# Patient Record
Sex: Male | Born: 1960 | Race: Black or African American | Hispanic: No | Marital: Single
Health system: Southern US, Community
[De-identification: ages and names within clinical notes are randomized; demographics above are authoritative.]

---

## 2019-10-31 ENCOUNTER — Emergency Department (HOSPITAL_COMMUNITY): Payer: Self-pay

## 2019-10-31 ENCOUNTER — Emergency Department (HOSPITAL_COMMUNITY)
Admission: EM | Admit: 2019-10-31 | Discharge: 2019-10-31 | Payer: Self-pay | Attending: Emergency Medicine | Admitting: Emergency Medicine

## 2019-10-31 ENCOUNTER — Other Ambulatory Visit: Payer: Self-pay

## 2019-10-31 ENCOUNTER — Encounter (HOSPITAL_COMMUNITY): Payer: Self-pay

## 2019-10-31 DIAGNOSIS — Z590 Homelessness: Secondary | ICD-10-CM | POA: Insufficient documentation

## 2019-10-31 DIAGNOSIS — R0602 Shortness of breath: Secondary | ICD-10-CM | POA: Insufficient documentation

## 2019-10-31 DIAGNOSIS — R0789 Other chest pain: Secondary | ICD-10-CM | POA: Insufficient documentation

## 2019-10-31 DIAGNOSIS — Z20828 Contact with and (suspected) exposure to other viral communicable diseases: Secondary | ICD-10-CM | POA: Insufficient documentation

## 2019-10-31 DIAGNOSIS — I10 Essential (primary) hypertension: Secondary | ICD-10-CM | POA: Insufficient documentation

## 2019-10-31 LAB — CBC WITH DIFFERENTIAL/PLATELET
Abs Immature Granulocytes: 0.02 10*3/uL (ref 0.00–0.07)
Basophils Absolute: 0 10*3/uL (ref 0.0–0.1)
Basophils Relative: 1 %
Eosinophils Absolute: 0.1 10*3/uL (ref 0.0–0.5)
Eosinophils Relative: 1 %
HCT: 47.6 % (ref 39.0–52.0)
Hemoglobin: 16 g/dL (ref 13.0–17.0)
Immature Granulocytes: 1 %
Lymphocytes Relative: 25 %
Lymphs Abs: 1.1 10*3/uL (ref 0.7–4.0)
MCH: 31.5 pg (ref 26.0–34.0)
MCHC: 33.6 g/dL (ref 30.0–36.0)
MCV: 93.7 fL (ref 80.0–100.0)
Monocytes Absolute: 0.3 10*3/uL (ref 0.1–1.0)
Monocytes Relative: 7 %
Neutro Abs: 2.9 10*3/uL (ref 1.7–7.7)
Neutrophils Relative %: 65 %
Platelets: 203 10*3/uL (ref 150–400)
RBC: 5.08 MIL/uL (ref 4.22–5.81)
RDW: 12.5 % (ref 11.5–15.5)
WBC: 4.3 10*3/uL (ref 4.0–10.5)
nRBC: 0 % (ref 0.0–0.2)

## 2019-10-31 LAB — COMPREHENSIVE METABOLIC PANEL
ALT: 28 U/L (ref 0–44)
AST: 26 U/L (ref 15–41)
Albumin: 4.3 g/dL (ref 3.5–5.0)
Alkaline Phosphatase: 52 U/L (ref 38–126)
Anion gap: 10 (ref 5–15)
BUN: 20 mg/dL (ref 6–20)
CO2: 21 mmol/L — ABNORMAL LOW (ref 22–32)
Calcium: 9.4 mg/dL (ref 8.9–10.3)
Chloride: 107 mmol/L (ref 98–111)
Creatinine, Ser: 1.37 mg/dL — ABNORMAL HIGH (ref 0.61–1.24)
GFR calc Af Amer: >60 mL/min (ref >60)
GFR calc non Af Amer: 56 mL/min — ABNORMAL LOW (ref >60)
Glucose, Bld: 96 mg/dL (ref 70–99)
Potassium: 4.9 mmol/L (ref 3.5–5.1)
Sodium: 138 mmol/L (ref 135–145)
Total Bilirubin: 0.6 mg/dL (ref 0.3–1.2)
Total Protein: 7.5 g/dL (ref 6.5–8.1)

## 2019-10-31 LAB — BRAIN NATRIURETIC PEPTIDE: B Natriuretic Peptide: 24.4 pg/mL (ref 0.0–100.0)

## 2019-10-31 LAB — TROPONIN I (HIGH SENSITIVITY)
Troponin I (High Sensitivity): 7 ng/L (ref <18)
Troponin I (High Sensitivity): 8 ng/L (ref <18)

## 2019-10-31 LAB — SARS CORONAVIRUS 2 (TAT 6-24 HRS): SARS Coronavirus 2: NEGATIVE

## 2019-10-31 MED ORDER — HYDROCHLOROTHIAZIDE 25 MG PO TABS
25.0000 mg | ORAL_TABLET | Freq: Once | ORAL | Status: AC
  Administered 2019-10-31: 25 mg via ORAL
  Filled 2019-10-31: qty 1

## 2019-10-31 MED ORDER — HYDROCODONE-ACETAMINOPHEN 5-325 MG PO TABS
1.0000 | ORAL_TABLET | Freq: Once | ORAL | Status: AC
  Administered 2019-10-31: 1 via ORAL
  Filled 2019-10-31: qty 1

## 2019-10-31 MED ORDER — NITROGLYCERIN 0.4 MG SL SUBL
0.4000 mg | SUBLINGUAL_TABLET | SUBLINGUAL | Status: DC | PRN
  Administered 2019-10-31 (×2): 0.4 mg via SUBLINGUAL
  Filled 2019-10-31 (×2): qty 1

## 2019-10-31 MED ORDER — DOXYCYCLINE HYCLATE 100 MG PO TABS
100.0000 mg | ORAL_TABLET | Freq: Once | ORAL | Status: AC
  Administered 2019-10-31: 100 mg via ORAL
  Filled 2019-10-31: qty 1

## 2019-10-31 MED ORDER — ASPIRIN 81 MG PO CHEW
324.0000 mg | CHEWABLE_TABLET | Freq: Once | ORAL | Status: DC
  Filled 2019-10-31: qty 4

## 2019-10-31 NOTE — ED Notes (Signed)
Patient Alert and oriented to baseline. Stable and ambulatory to baseline. Patient verbalized understanding of the discharge instructions. AVS given to [facility] upon discharge. Pt placed in wheelchair with PD escort upon discharge.

## 2019-10-31 NOTE — Discharge Instructions (Addendum)
As discussed, your evaluation today has been largely reassuring.  But, it is important that you monitor your condition carefully, and do not hesitate to return to the ED if you develop new, or concerning changes in your condition. ? ?Otherwise, please follow-up with your physician for appropriate ongoing care. ? ?

## 2019-10-31 NOTE — ED Triage Notes (Signed)
Pt arrived via [facility] from [place] c/o new onset cp that started last night. Pt also c/o left hand and right foot tingling as well as bilateral ankle swelling. Pt is anxious at time of triage. PD at nurses station in direct sight of pt. No hostility noted at this time. Pt is alert and oriented x4. Current cp rated as 10/10. 324 ASA given PTA w/ EMS. Pt has hx of illicit drug use. PT states he tested positive for COVID-19 2 weeks ago.

## 2019-10-31 NOTE — ED Provider Notes (Signed)
[facility] Provider Note   CSN: [id number] Arrival date & time: 10/31/19  1254     History No chief complaint on file.   Lawrence Carter is a 58 y.o. male.  Patient is a 58 year old male with a history of enlarged heart, hypertension, cocaine abuse and homelessness who is presenting today in the custody of police with chest pain.  Patient states for the last week and a half he has had cough, fever, congestion, generalized aches and pains, intermittent chest pain and shortness of breath.  It has gradually been getting better but in the last few days he is noticed some mild swelling in his lower extremities and still generalized body aches.  Today he was found to be shoplifting and he attempted to flee the police and had to be restrained.  He states he has had some chest pressure in the last few weeks but it became much worse after the struggle.  He denies any injury.  Currently the pain is an 8 out of 10 and it is a pressure sensation in his sternum.  Patient denies significant productive cough and does not think he has had fever in the last few days.  He did go to the hospital last week but did not stay for results.  He is not currently taking any medications.  He does smoke cigarettes and last smoked cocaine approximately 1 week ago.  The history is provided by the patient.       History reviewed. No pertinent past medical history.  There are no problems to display for this patient.        History reviewed. No pertinent family history.  Social History   Tobacco Use  . Smoking status: Not on file  Substance Use Topics  . Alcohol use: Not on file  . Drug use: Not on file    Home Medications Prior to Admission medications   Not on File    Allergies    Patient has no allergy information on record.  Review of Systems   Review of Systems  All other systems reviewed and are negative.   Physical Exam Updated Vital Signs BP (!)  175/116   Pulse 63   Temp 98.3 F (36.8 C) (Oral)   Resp (!) 21   SpO2 99%   Physical Exam Vitals and nursing note reviewed.  Constitutional:      General: He is not in acute distress.    Appearance: Normal appearance. He is well-developed and normal weight.  HENT:     Head: Normocephalic and atraumatic.     Nose: Nose normal.  Eyes:     Conjunctiva/sclera: Conjunctivae normal.     Pupils: Pupils are equal, round, and reactive to light.  Cardiovascular:     Rate and Rhythm: Normal rate and regular rhythm.     Pulses: Normal pulses.     Heart sounds: No murmur.  Pulmonary:     Effort: Pulmonary effort is normal. No respiratory distress.     Breath sounds: Normal breath sounds. No wheezing or rales.  Chest:     Chest wall: Tenderness present.  Abdominal:     General: There is no distension.     Palpations: Abdomen is soft.     Tenderness: There is no abdominal tenderness. There is no guarding or rebound.  Musculoskeletal:        General: No tenderness. Normal range of motion.     Cervical back: Normal range of motion and neck supple.  Right lower leg: Edema present.     Left lower leg: Edema present.     Comments: 1+ pitting edema bilateral ankles  Skin:    General: Skin is warm and dry.     Capillary Refill: Capillary refill takes 2 to 3 seconds.     Findings: No erythema or rash.  Neurological:     General: No focal deficit present.     Mental Status: He is alert and oriented to person, place, and time. Mental status is at baseline.  Psychiatric:        Mood and Affect: Mood normal.        Behavior: Behavior normal.        Thought Content: Thought content normal.     ED Results / Procedures / Treatments   Labs (all labs ordered are listed, but only abnormal results are displayed) Labs Reviewed  COMPREHENSIVE METABOLIC PANEL - Abnormal; Notable for the following components:      Result Value   CO2 21 (*)    Creatinine, Ser 1.37 (*)    GFR calc non Af Amer  56 (*)    All other components within normal limits  SARS CORONAVIRUS 2 (TAT 6-24 HRS)  CBC WITH DIFFERENTIAL/PLATELET  BRAIN NATRIURETIC PEPTIDE  TROPONIN I (HIGH SENSITIVITY)  TROPONIN I (HIGH SENSITIVITY)    EKG EKG Interpretation  Date/Time:  Wednesday October 31 2019 13:44:40 EST Ventricular Rate:  61 PR Interval:    QRS Duration: 128 QT Interval:  382 QTC Calculation: 385 R Axis:   -27 Text Interpretation: Sinus rhythm Left ventricular hypertrophy Nonspecific T abnormalities, inferior leads ST elevation, consider anterior injury No previous tracing Confirmed by Gwyneth Sprout ([id number]) on 10/31/2019 1:49:40 PM   Radiology DG Chest Port 1 View  Result Date: 10/31/2019 CLINICAL DATA:  Incarcerated, new onset chest pain, COVID-19 positive, bilateral ankle swelling EXAM: PORTABLE CHEST 1 VIEW COMPARISON:  CTA chest and radiograph 05/11/2018 FINDINGS: Lung volumes are diminished. Patchy and streaky opacities in the infrahilar lungs. No pneumothorax or effusion. The cardiomediastinal contours are unremarkable. No acute osseous or soft tissue abnormality. Degenerative changes are present in the imaged spine and shoulders. IMPRESSION: Some streaky and patchy opacities could reflect atelectatic change in the setting of low lung volumes although early infection could have a similar appearance. Electronically Signed   By: Kreg Shropshire M.D.   On: 10/31/2019 14:40    Procedures Procedures (including critical care time)  Medications Ordered in ED Medications  nitroGLYCERIN (NITROSTAT) SL tablet 0.4 mg (has no administration in time range)  aspirin chewable tablet 324 mg (has no administration in time range)    ED Course  I have reviewed the triage vital signs and the nursing notes.  Pertinent labs & imaging results that were available during my care of the patient were reviewed by me and considered in my medical decision making (see chart for details).    MDM  Rules/Calculators/A&P                      58 year old male presenting with atypical chest pain.  This has been in the setting of some URI symptoms in the last week but was worse with exertion today when he attempted to flee the police.  Pain is reproducible with palpation and deep breathing.  He does have some mild edema in bilateral lower extremities and has been sleeping in a car the last few days.  Patient was seen at [facility] in [facility] for atypical chest  pain and at that time had an echo and EKG but was not deemed to be a STEMI.  Patient did have LVH at the time.  Since this time patient has continued to use cocaine and tobacco and takes no medications for his underlying medical conditions.  Concern for possible recurrent ACS versus infectious etiology versus musculoskeletal versus CHF.  Low suspicion today for dissection, PE.  EKG today with nonspecific ST abnormalities and LVH.  Will cycle troponins and get a chest x-ray.  Patient was given aspirin and nitroglycerin to see if that helped with his pain.  Also will Covid swab with infectious symptoms last week.  4:09 PM Patient's labs are reassuring.  BMP within normal limits.  Chest x-ray with possible early infiltrate in with 1 week of infectious-like symptoms possible early pneumonia or Covid.  Repeat troponin is pending. If delta trop is neg pt should be safe for d/c with abx.  Still having atypical sx.  Final Clinical Impression(s) / ED Diagnoses Final diagnoses:  None    Rx / DC Orders ED Discharge Orders    None       Blanchie Dessert, MD 10/31/19 1616

## 2019-11-03 ENCOUNTER — Other Ambulatory Visit: Payer: Self-pay

## 2019-11-03 ENCOUNTER — Emergency Department (HOSPITAL_COMMUNITY): Payer: Self-pay

## 2019-11-03 ENCOUNTER — Emergency Department (HOSPITAL_COMMUNITY)
Admission: EM | Admit: 2019-11-03 | Discharge: 2019-11-03 | Payer: Self-pay | Attending: Emergency Medicine | Admitting: Emergency Medicine

## 2019-11-03 DIAGNOSIS — M7061 Trochanteric bursitis, right hip: Secondary | ICD-10-CM

## 2019-11-03 DIAGNOSIS — Y939 Activity, unspecified: Secondary | ICD-10-CM | POA: Insufficient documentation

## 2019-11-03 MED ORDER — IBUPROFEN 400 MG PO TABS
600.0000 mg | ORAL_TABLET | Freq: Once | ORAL | Status: AC
  Administered 2019-11-03: 600 mg via ORAL
  Filled 2019-11-03: qty 1

## 2019-11-03 MED ORDER — OXYCODONE-ACETAMINOPHEN 5-325 MG PO TABS
1.0000 | ORAL_TABLET | Freq: Once | ORAL | Status: AC
  Administered 2019-11-03: 1 via ORAL
  Filled 2019-11-03: qty 1

## 2019-11-03 MED ORDER — DEXAMETHASONE 4 MG PO TABS
8.0000 mg | ORAL_TABLET | Freq: Once | ORAL | Status: AC
  Administered 2019-11-03: 8 mg via ORAL
  Filled 2019-11-03: qty 2

## 2019-11-03 NOTE — ED Triage Notes (Addendum)
Came in thru [facility], reportedly have been having persistent cough, tested for on Tuesday and resulted positive. Main complaint at this time is pain in right arm and right leg. VSS, and was 98% O2 saturation on RA.  - per hospital records, patient tested negative for COVID on the 16th of this month.

## 2019-11-11 NOTE — ED Provider Notes (Signed)
[facility] EMERGENCY DEPARTMENT Provider Note   CSN: [id number] Arrival date & time: 11/03/19  2003     History Chief Complaint  Patient presents with  . Leg Pain    on the right with right arm pain  . Cough    Lawrence Carter is a 58 y.o. male.  HPI   58 year old male with multiple complaints. Is primarily concerned about pain in his right hip. Lateral hip. Denies any acute trauma or strain. Ache at rest. Worse with movement and touch over the area. Has not tried taking anything for it.  No past medical history on file.  There are no problems to display for this patient.      No family history on file.  Social History   Tobacco Use  . Smoking status: Not on file  Substance Use Topics  . Alcohol use: Not on file  . Drug use: Not on file    Home Medications Prior to Admission medications   Not on File    Allergies    Patient has no allergy information on record.  Review of Systems   Review of Systems All systems reviewed and negative, other than as noted in HPI.  Physical Exam Updated Vital Signs BP (!) 145/100   Pulse (!) 58   Temp 97.9 F (36.6 C) (Oral)   Resp 20   Ht 5\' 9"  (1.753 m)   Wt 95.3 kg   SpO2 97%   BMI 31.01 kg/m   Physical Exam Vitals and nursing note reviewed.  Constitutional:      General: He is not in acute distress.    Appearance: He is well-developed.  HENT:     Head: Normocephalic and atraumatic.  Eyes:     General:        Right eye: No discharge.        Left eye: No discharge.     Conjunctiva/sclera: Conjunctivae normal.  Cardiovascular:     Rate and Rhythm: Normal rate and regular rhythm.     Heart sounds: Normal heart sounds. No murmur. No friction rub. No gallop.   Pulmonary:     Effort: Pulmonary effort is normal. No respiratory distress.     Breath sounds: Normal breath sounds.  Abdominal:     General: There is no distension.     Palpations: Abdomen is soft.     Tenderness: There is no  abdominal tenderness.  Musculoskeletal:        General: No tenderness.     Cervical back: Neck supple.     Comments: Tenderness to palpation over the greater trochanter of the right femur. No overlying skin changes. Is able to actively range at the hip without apparent discomfort. Neurovascular intact. No midline spinal tenderness.  Skin:    General: Skin is warm and dry.  Neurological:     Mental Status: He is alert.  Psychiatric:        Behavior: Behavior normal.        Thought Content: Thought content normal.     ED Results / Procedures / Treatments   Labs (all labs ordered are listed, but only abnormal results are displayed) Labs Reviewed - No data to display  EKG None  Radiology No results found.  Procedures Procedures (including critical care time)  Medications Ordered in ED Medications  dexamethasone (DECADRON) tablet 8 mg (8 mg Oral Given 11/03/19 2049)  oxyCODONE-acetaminophen (PERCOCET/ROXICET) 5-325 MG per tablet 1 tablet (1 tablet Oral Given 11/03/19 2050)  ibuprofen (ADVIL)  tablet 600 mg (600 mg Oral Given 11/03/19 2050)    ED Course  I have reviewed the triage vital signs and the nursing notes.  Pertinent labs & imaging results that were available during my care of the patient were reviewed by me and considered in my medical decision making (see chart for details).    MDM Rules/Calculators/A&P                      58 year old male with likely trochanteric bursitis of his right femur. Plan symptomatic treatment. Doubt DVT, serious infection or other emergent process. Final Clinical Impression(s) / ED Diagnoses Final diagnoses:  Trochanteric bursitis of right hip    Rx / DC Orders ED Discharge Orders    None       Raeford Razor, MD 11/11/19 1003

## 2021-07-09 IMAGING — DX DG HIP (WITH OR WITHOUT PELVIS) 2-3V*R*
3 series · 3 of 3 positions shown · non-contrast
Comparison: None.

CLINICAL DATA: Right hip pain

EXAM:
DG HIP (WITH OR WITHOUT PELVIS) 2-3V RIGHT

[pelvis ap]
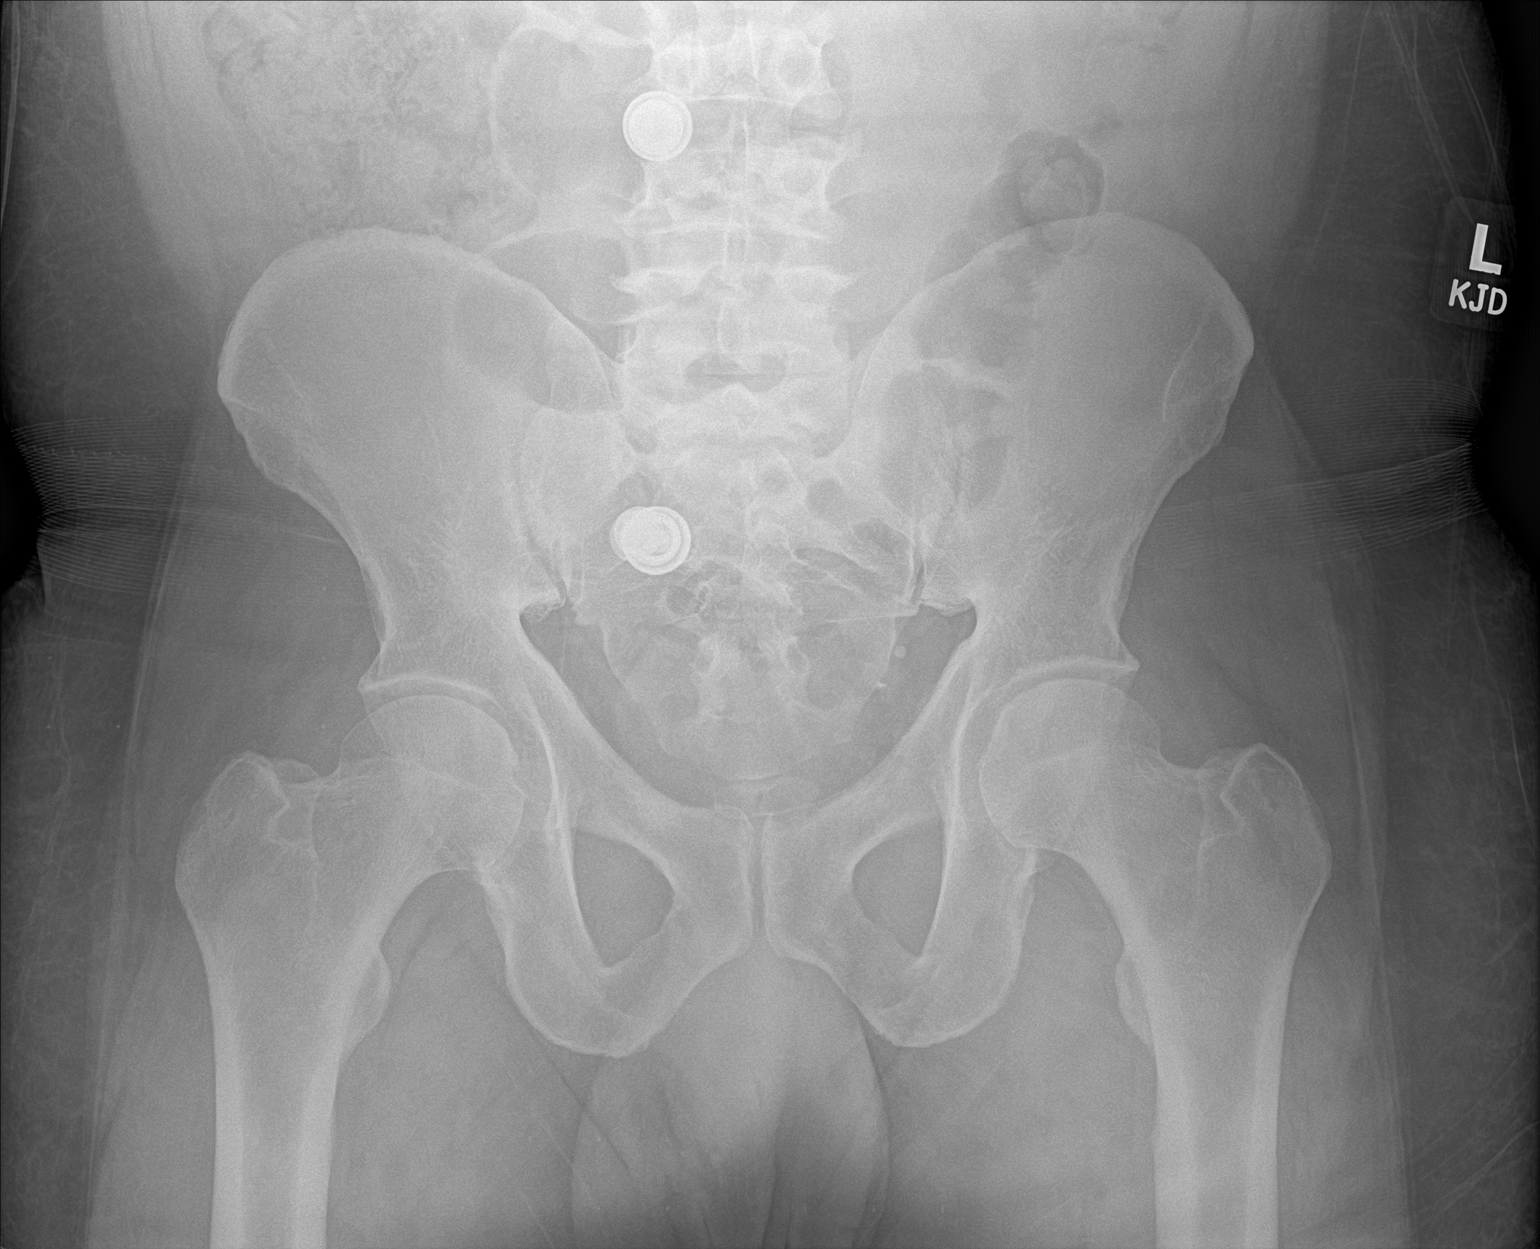

[hip ap]
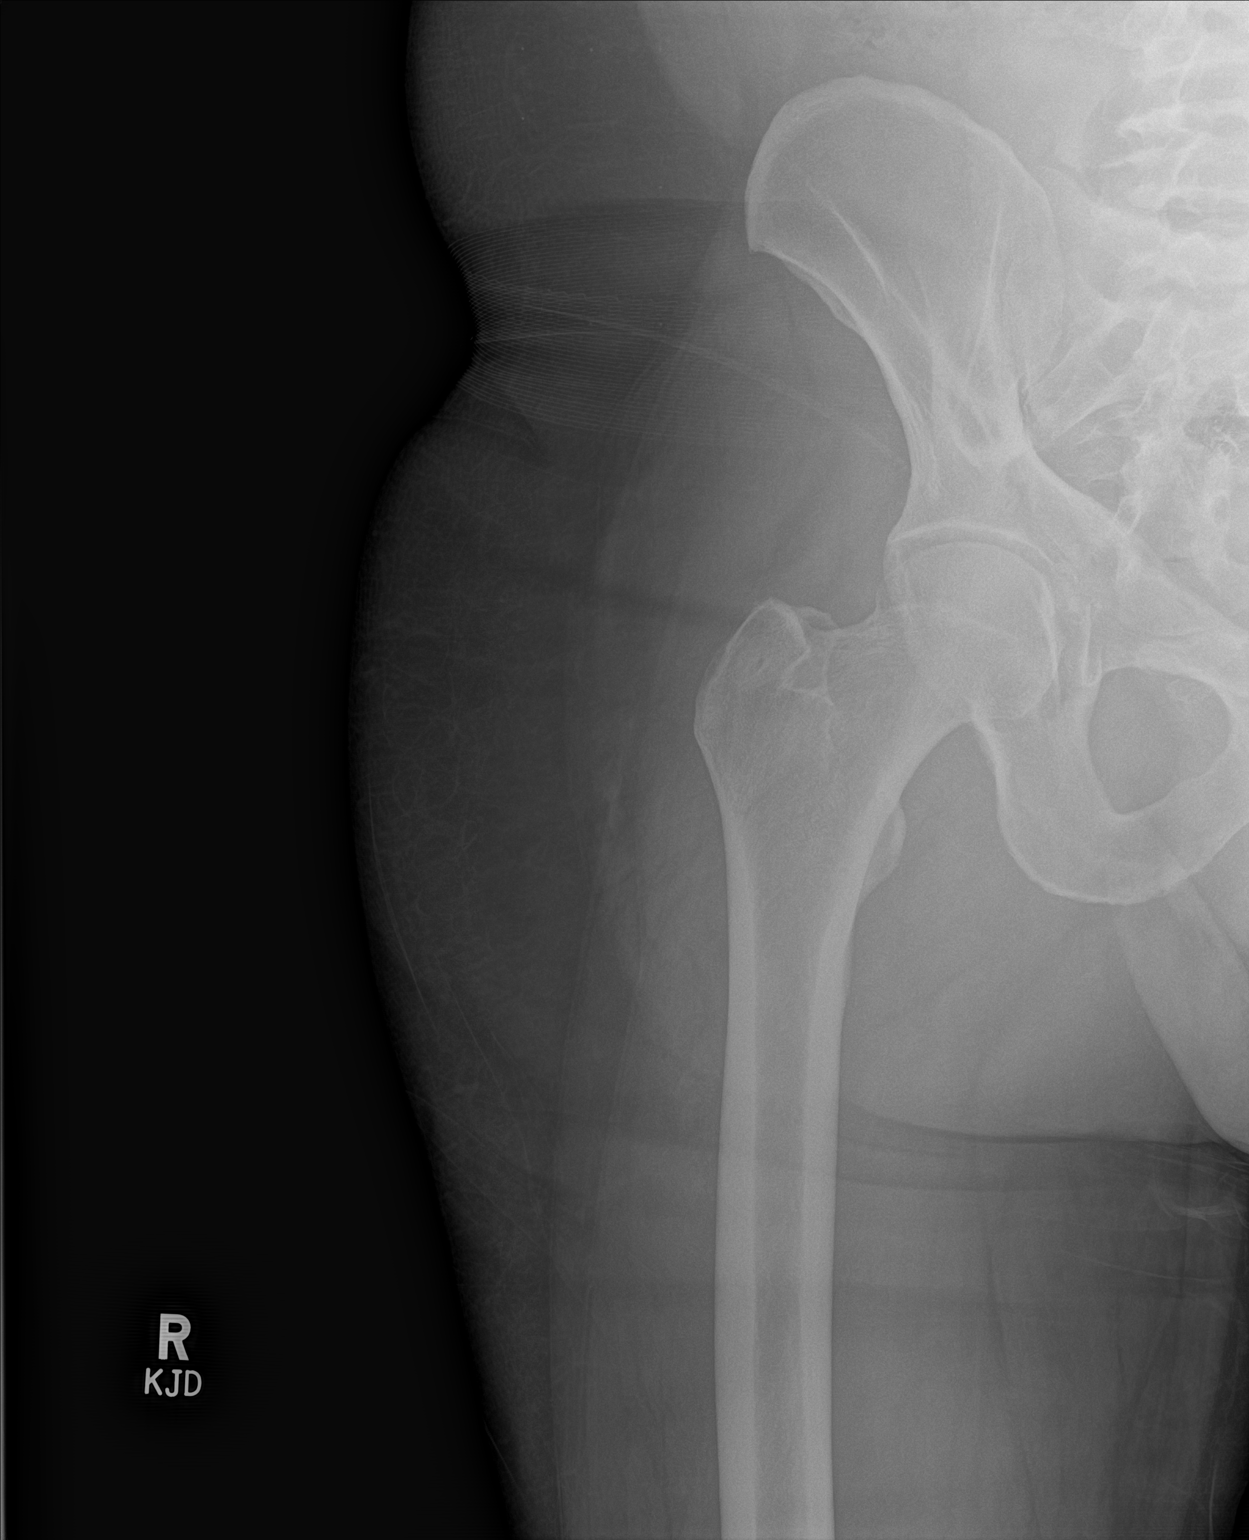

[hip lat]
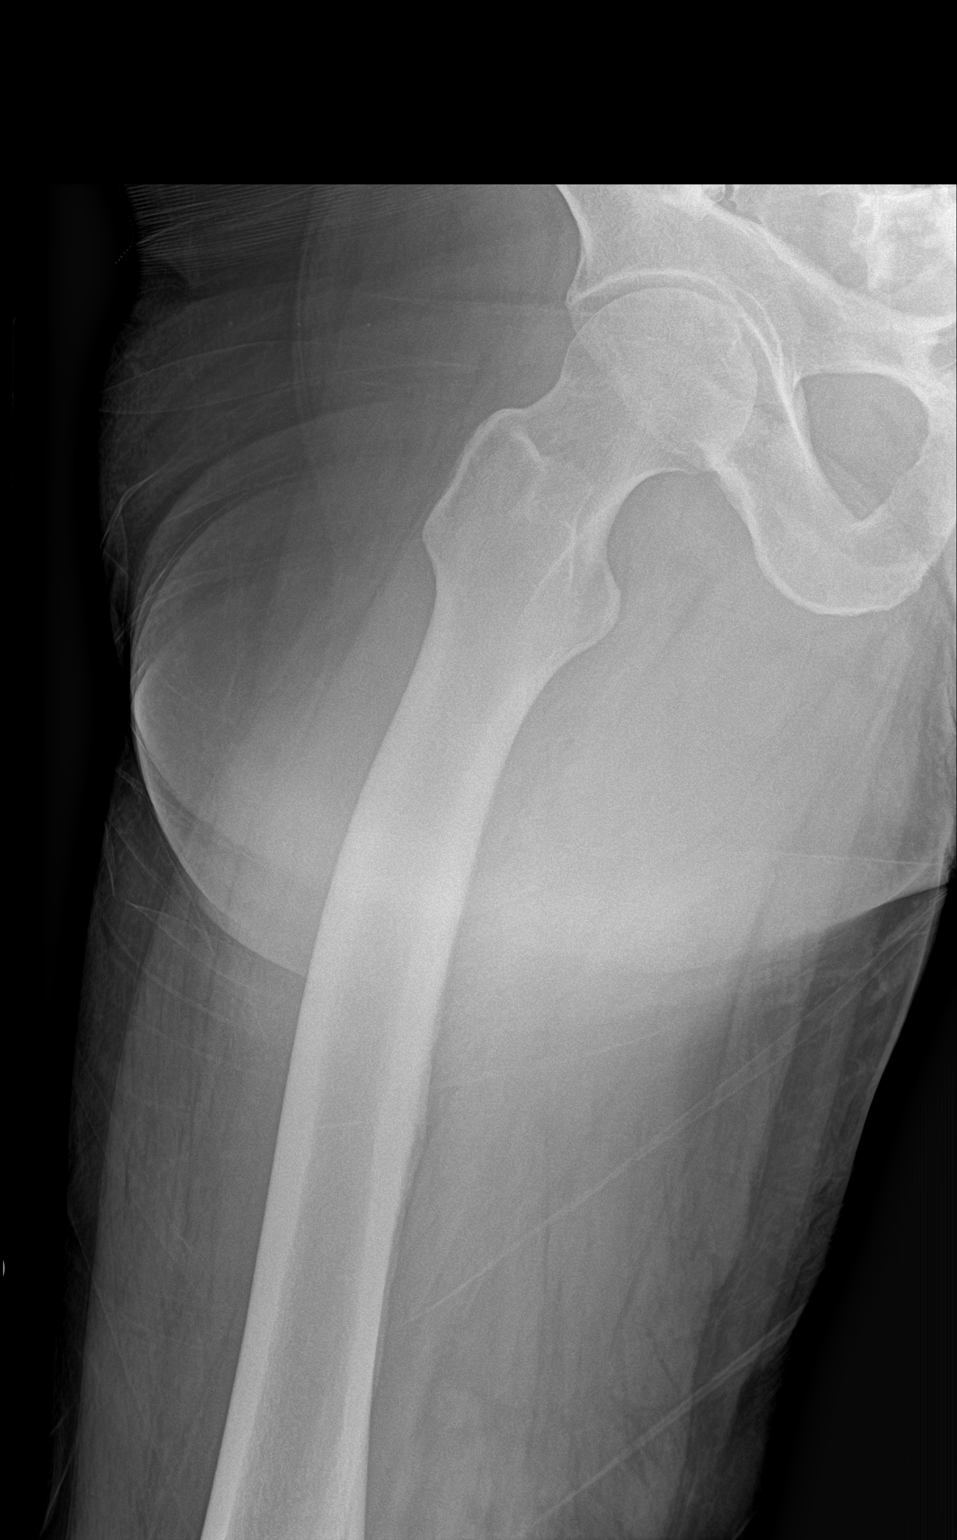

[3 of 3 positions shown; findings below may reference images not displayed]

FINDINGS: There is no evidence of hip fracture or dislocation. Mild superior
joint space loss with marginal osteophyte formation.
IMPRESSION: No acute osseous abnormality.
